# Patient Record
Sex: Male | Born: 1945
Health system: Southern US, Community
[De-identification: ages and names within clinical notes are randomized; demographics above are authoritative.]

## PROBLEM LIST (undated history)

## (undated) DIAGNOSIS — G4730 Sleep apnea, unspecified: Secondary | ICD-10-CM

## (undated) DIAGNOSIS — E079 Disorder of thyroid, unspecified: Secondary | ICD-10-CM

## (undated) DIAGNOSIS — I10 Essential (primary) hypertension: Secondary | ICD-10-CM

## (undated) DIAGNOSIS — H8101 Meniere's disease, right ear: Secondary | ICD-10-CM

## (undated) HISTORY — PX: CARDIAC CATHETERIZATION: SHX172

## (undated) HISTORY — PX: BLOOD PATCH: SHX1245

---

## 2008-11-03 ENCOUNTER — Encounter: Admission: RE | Admit: 2008-11-03 | Discharge: 2008-11-03 | Payer: Self-pay | Admitting: Cardiology

## 2008-11-07 ENCOUNTER — Ambulatory Visit (HOSPITAL_COMMUNITY): Admission: RE | Admit: 2008-11-07 | Discharge: 2008-11-07 | Payer: Self-pay | Admitting: Cardiology

## 2009-08-19 IMAGING — CR DG CHEST 2V
2 series · 2 of 2 positions shown · non-contrast
Comparison: None

CLINICAL DATA: Chest pain, cough, short of breath, pre cardiac
catheterization

CHEST - 2 VIEW

[view not recorded (1 of 2)]
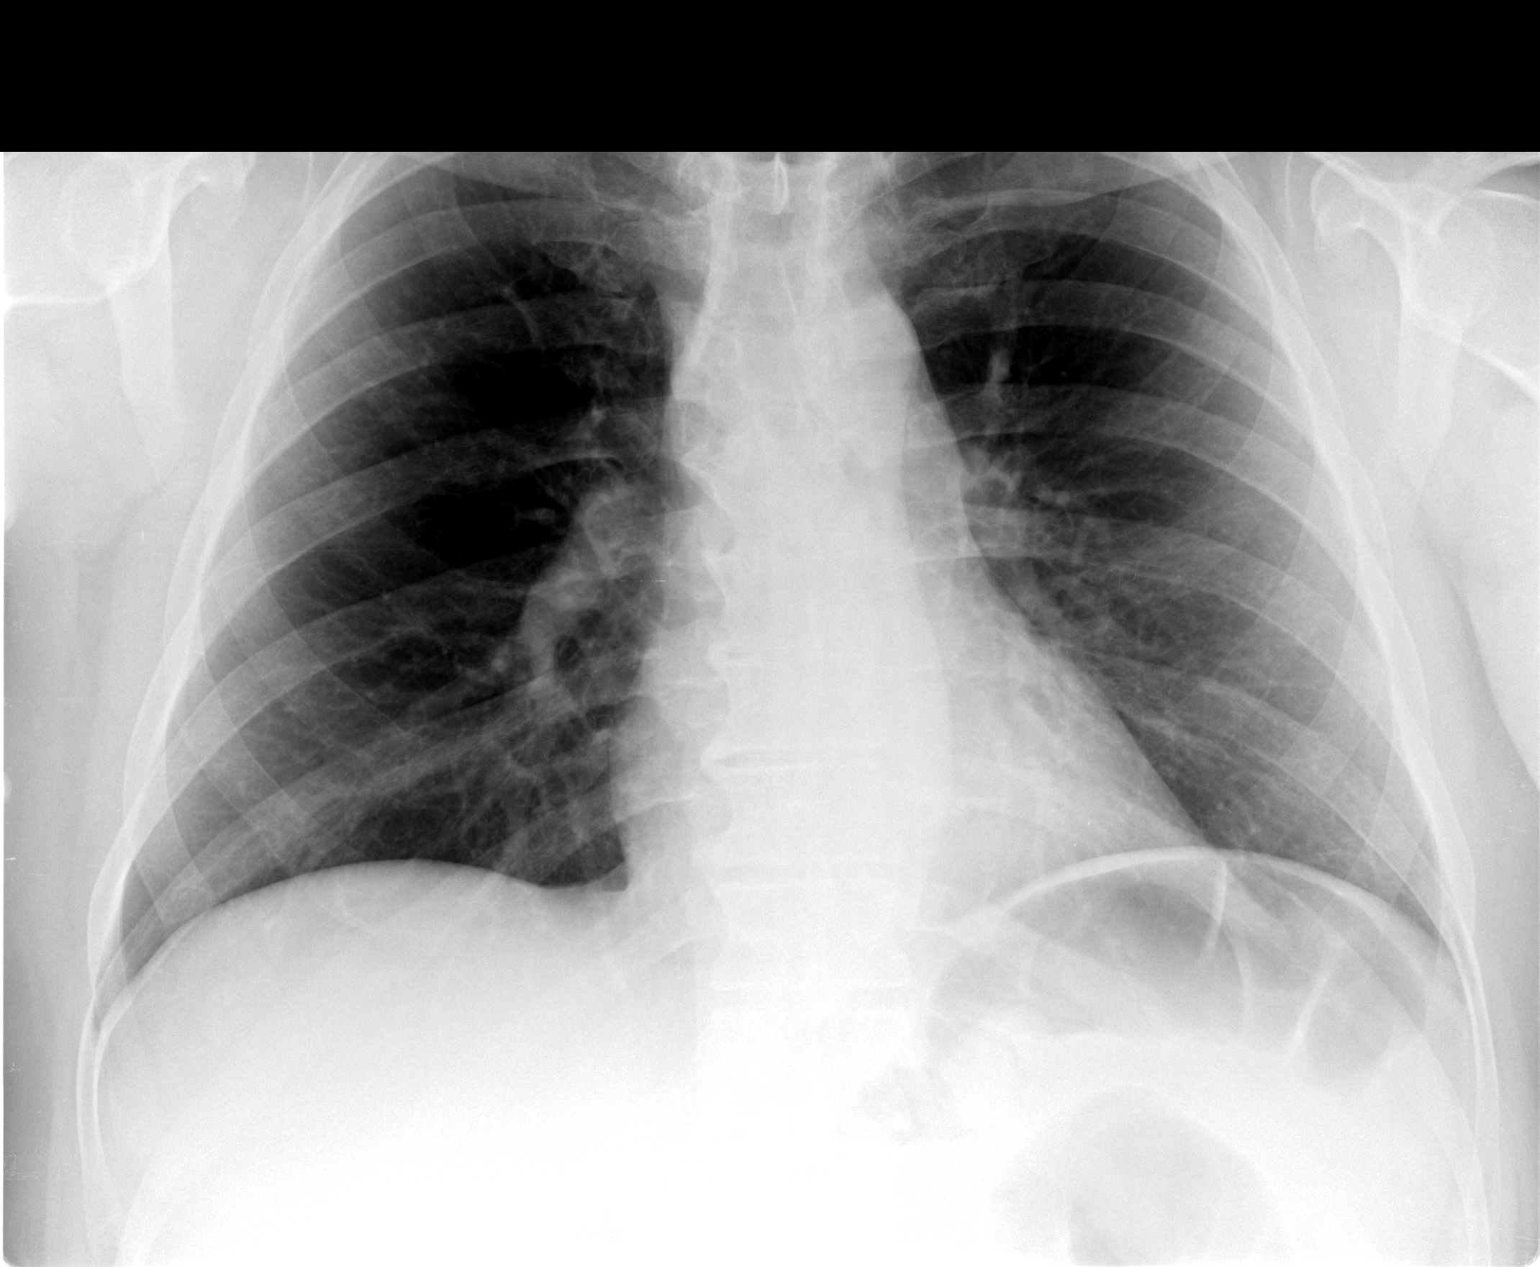

[view not recorded (2 of 2)]
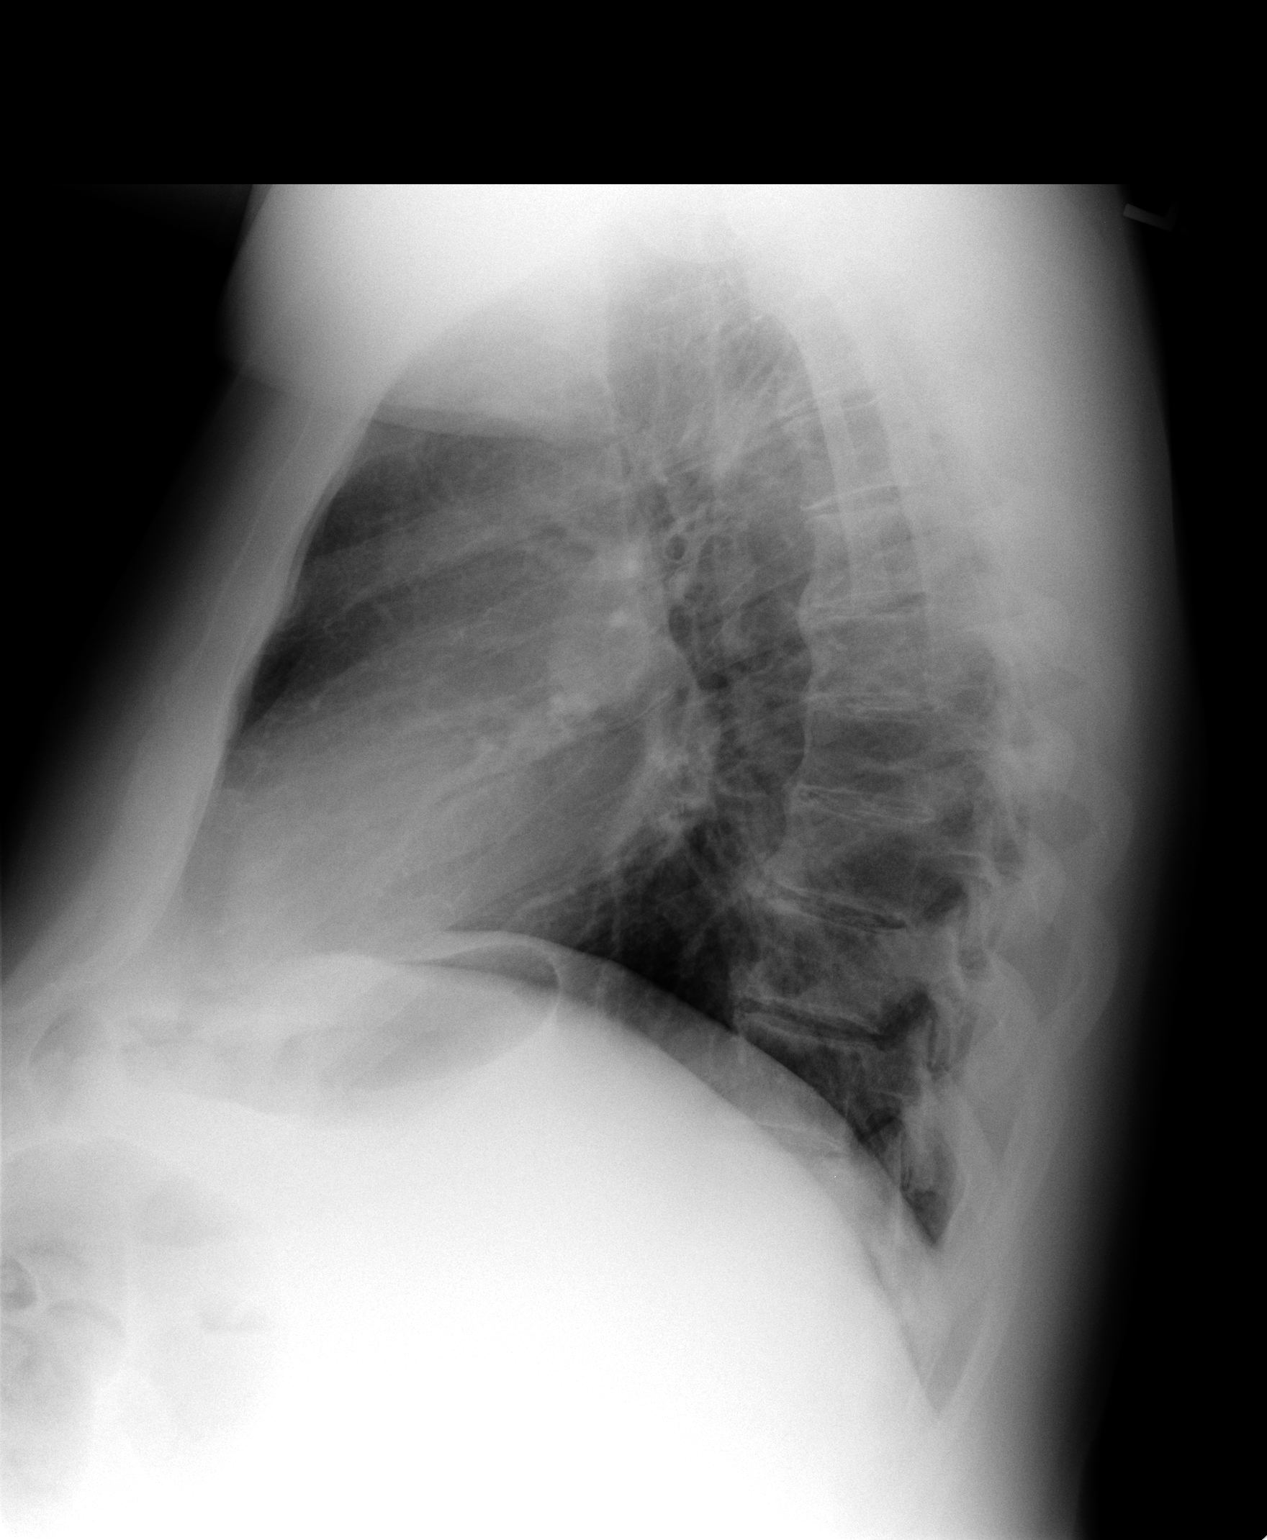

[2 of 2 positions shown; findings below may reference images not displayed]

FINDINGS: The lungs are clear.  The heart is within upper limits of
normal.  There are degenerative changes throughout the thoracic
spine.
IMPRESSION: No active lung disease.  Borderline cardiomegaly.

## 2011-03-01 NOTE — Cardiovascular Report (Signed)
NAMEKHADIM, Jeffery Ramsey             ACCOUNT NO.:  [id number]   MEDICAL RECORD NO.:  [id number]          PATIENT TYPE:  OIB   LOCATION:  2899                         FACILITY:  [facility]   PHYSICIAN:  Sheliah Mends, MD      DATE OF BIRTH:  04/16/1946   DATE OF PROCEDURE:  11/07/2008  DATE OF DISCHARGE:  11/07/2008                            CARDIAC CATHETERIZATION   PROCEDURES:  Retrograde aortography, selective coronary angiography, and  left ventriculogram.   CARDIOLOGIST:  1. Sheliah Mends, MD  2. Nicki Guadalajara, MD   INDICATIONS:  This is a 65 year old gentleman who was referred with  frequent episodes of chest tightness and chest pressure.  [name]  has multiple risk factors for coronary artery disease including age-  obesity, hypertension, dyslipidemia, and a strong family history.  He  had an extensive noninvasive workup that did not reveal any significant  abnormalities.  In context of his symptoms and risk factors, the  decision was made to proceed with coronary angiography.   PROCEDURE:  After informed consent was obtained, the patient was brought  to the second floor cardiac cath lab at [facility].  Access was  obtained via the right common femoral artery using the modified  Seldinger technique.  A 5-French arterial sheath was placed and  subsequently selective coronary angiography of the left and right  coronary artery system performed using a right and left 5-French Judkins  catheter.   FINDINGS:  Left coronary artery system.  The left main coronary artery  bifurcates into the LAD and left circumflex artery.  The left main  artery is short, medium caliber vessel.  The left main artery has no  angiographically significant disease.  The LAD is a large vessel that  wraps around the apex.  It gives off 2 diagonal branches.  The LAD is a  large size medium caliber vessel without angiographically significant  disease.  The diagonal branches are free of disease.   The left  circumflex artery is a medium-size vessel that gives off a large  branching obtuse marginal branch.  The left circumflex artery has no  angiographically significant disease.  The selective coronary  angiography of the right coronary artery shows a dominant right coronary  artery with PDA branch.  There is no angiographically significant  disease.   HEMODYNAMICS:  Left ventricular pressure 130/0 mmHg, aortic pressure  125/55 mmHg.   The procedure was completed without complication.   SUMMARY:  1. No angiographically significant coronary artery disease in the left      or right coronary artery system.  2. Normal left ventricular function.  3. No significant mitral or aortic regurgitation.   Dr. Nicki Guadalajara was present throughout the procedure and proctored the  case.      Sheliah Mends, MD  Electronically Signed     JE/MEDQ  D:  11/07/2008  T:  11/07/2008  Job:  [id number]

## 2011-06-16 ENCOUNTER — Encounter (HOSPITAL_COMMUNITY): Payer: Self-pay

## 2011-06-16 ENCOUNTER — Ambulatory Visit (HOSPITAL_COMMUNITY)
Admission: RE | Admit: 2011-06-16 | Discharge: 2011-06-16 | Disposition: A | Discharge status: Home / Self Care | Payer: PRIVATE HEALTH INSURANCE | Source: Ambulatory Visit | Attending: Internal Medicine | Admitting: Internal Medicine

## 2011-06-16 DIAGNOSIS — R609 Edema, unspecified: Secondary | ICD-10-CM | POA: Insufficient documentation

## 2011-06-16 DIAGNOSIS — R0602 Shortness of breath: Secondary | ICD-10-CM | POA: Insufficient documentation

## 2011-06-16 HISTORY — DX: Meniere's disease, right ear: H81.01

## 2011-06-16 HISTORY — DX: Sleep apnea, unspecified: G47.30

## 2011-06-16 HISTORY — DX: Essential (primary) hypertension: I10

## 2011-06-16 HISTORY — DX: Disorder of thyroid, unspecified: E07.9

## 2011-06-16 NOTE — Patient Instructions (Signed)
Your physician has recommended that you have a pulmonary function test. Pulmonary Function Tests are a group of tests that measure how well air moves in and out of your lungs.  Your physician has requested that you have an echocardiogram. Echocardiography is a painless test that uses sound waves to create images of your heart. It provides your doctor with information about the size and shape of your heart and how well your heart's chambers and valves are working. This procedure takes approximately one hour. There are no restrictions for this procedure.  Your physician recommends that you schedule a follow-up appointment in: 1 month

## 2011-06-16 NOTE — Assessment & Plan Note (Signed)
Based on the results of his cardiac cath and current symptoms, I suspect his dyspnea is not primarily cardiac in nature. Instead, I think it is likely due to untreated OSA, obesity and deconditioning. I have suggested he follow up with his sleep study as Dr. Marina Goodell has suggested. To complete the w/u will get a 2-D echo to evaluate diastolic dysfunction and pulmonary HTN. We will also check PFTs. Have advised him also on the need for weight loss and [facility]

## 2011-06-16 NOTE — Assessment & Plan Note (Signed)
I suspect this is due to venous insufficiency. As he has not improved with lasix will have him stop this and just use prn for severe edema. Can use compression stockings as needed. Check echo to evaluate for pulm HTN or diastolic dysfunction as contributors.

## 2011-06-21 ENCOUNTER — Ambulatory Visit (HOSPITAL_COMMUNITY)
Admission: RE | Admit: 2011-06-21 | Discharge: 2011-06-21 | Disposition: A | Discharge status: Home / Self Care | Payer: PRIVATE HEALTH INSURANCE | Source: Ambulatory Visit | Attending: Internal Medicine | Admitting: Internal Medicine

## 2011-06-21 DIAGNOSIS — I509 Heart failure, unspecified: Secondary | ICD-10-CM | POA: Insufficient documentation

## 2011-06-21 LAB — PULMONARY FUNCTION TEST

## 2011-07-15 ENCOUNTER — Encounter: Payer: Self-pay | Admitting: Internal Medicine

## 2011-07-20 ENCOUNTER — Other Ambulatory Visit (HOSPITAL_COMMUNITY): Payer: PRIVATE HEALTH INSURANCE

## 2011-07-20 ENCOUNTER — Ambulatory Visit (HOSPITAL_COMMUNITY): Payer: PRIVATE HEALTH INSURANCE

## 2011-07-28 ENCOUNTER — Ambulatory Visit (HOSPITAL_COMMUNITY)
Admission: RE | Admit: 2011-07-28 | Discharge: 2011-07-28 | Disposition: A | Discharge status: Home / Self Care | Payer: PRIVATE HEALTH INSURANCE | Source: Ambulatory Visit | Attending: Internal Medicine | Admitting: Internal Medicine

## 2011-07-28 DIAGNOSIS — R609 Edema, unspecified: Secondary | ICD-10-CM

## 2011-07-28 DIAGNOSIS — I509 Heart failure, unspecified: Secondary | ICD-10-CM | POA: Insufficient documentation

## 2011-07-28 DIAGNOSIS — I10 Essential (primary) hypertension: Secondary | ICD-10-CM | POA: Insufficient documentation

## 2011-07-28 DIAGNOSIS — R0602 Shortness of breath: Secondary | ICD-10-CM

## 2011-07-28 DIAGNOSIS — I059 Rheumatic mitral valve disease, unspecified: Secondary | ICD-10-CM

## 2011-07-28 MED ORDER — SPIRONOLACTONE 25 MG PO TABS: ORAL_TABLET | ORAL | Status: AC

## 2011-07-28 NOTE — Assessment & Plan Note (Addendum)
ECHO reviewed. LVEF normal with mild diastolic dysfunction. RV mildly dilated with normal function. No evidence of PAH. PFTs normal. I suspect majority of his symptoms are due to his obesity and deconditioning. Unfortunately continues to gain weight. Stress need for weight loss. Will start spironolactone 12.5 daily to help with HTN and fluid status. He will be return to his primary care physician.

## 2011-07-28 NOTE — Progress Notes (Signed)
  HPI:  Mr. Seymore is a  65 y/o male with h/o HTN, HL, obesity and OSA who returns for f/u in the HF clinic.  In 10/2008 underwent cath by [facility] for CP and dyspnea. Coronaries were normal. LVEDP was low. We saw him in 8/12 in the HF clinic for further evaluation of dyspnea and edema which was not improving with lasix. We suspected that his dyspnea was likely due to untreated OSA and he had some venous insufficiency. Lasix stopped. We ordered echo for further evaluation of diastolic filling parameters and PFTs.  PFTs showed normal spirometry (06/21/2011). DLCO not checked.   He had echo this am which we reviewed. EF 60-65% Mild diastolic dysfunction. RV mildly dilated with normal function. No evidence of PAH on echo  Continues to have dyspnea on exertion. Dizziness improved with Meclizine. Sleeps in recliner.  He is not exercising. Weight is up 13 pounds.  He has support stockings but he can not put them on. He had a sleep study 07/25/2011. He is unemployed. He is able pay for his medications now however he expects to lose his insurance by the end of the year.   Walked in clinic sats 100% at rest. 94% with exertion.    ROS: All systems negative except as listed in HPI, PMH and Problem List.  Past Medical History  Diagnosis Date  . Thyroid disease   . Hypertension   . Meniere's disease of right ear   . Sleep apnea     Current Outpatient Prescriptions  Medication Sig Dispense Refill  . aspirin 81 MG tablet Take 81 mg by mouth daily.        Marland Kitchen levothyroxine (SYNTHROID, LEVOTHROID) 125 MCG tablet Take 125 mcg by mouth daily.        . meclizine (ANTIVERT) 25 MG tablet Take 25 mg by mouth 3 (three) times daily.        Marland Kitchen olmesartan-hydrochlorothiazide (BENICAR HCT) 20-12.5 MG per tablet Take 1 tablet by mouth daily.           PHYSICAL EXAM: Filed Vitals:   07/28/11 1119  BP: 140/72  Pulse: 60   General:  Well appearing. No resp difficulty. O2 sats. 94% HEENT: normal Neck: supple.  Thick. JVP hard to assess - does not appear markedly elevated. Carotids 2+ bilaterally; no bruits. No lymphadenopathy or thryomegaly appreciated. Cor: PMI normal. Regular rate & rhythm. No rubs, gallops or murmurs. Lungs: clear Abdomen: markedly obese. soft, nontender, nondistended. No hepatosplenomegaly. No bruits or masses. Good bowel sounds. Extremities: no cyanosis, clubbing, rash. Tr-1+ edema Neuro: alert & orientedx3, cranial nerves grossly intact. Moves all 4 extremities w/o difficulty. Affect pleasant   ASSESSMENT & PLAN:

## 2011-07-28 NOTE — Patient Instructions (Signed)
Please take Spironolactone 12.5 mg daily   Please start exercising  Please follow up with your primary care physician next week for lab work.

## 2011-07-28 NOTE — Assessment & Plan Note (Addendum)
Instructed to wear compression stockings while out of bed. Start Spironolactone 12.5 mg daily. He will need BMET next week.   Patient seen and examined with Tonye Becket, NP. We discussed all aspects of the encounter. I agree with the assessment and plan as stated above.

## 2011-08-22 ENCOUNTER — Encounter: Payer: Self-pay | Admitting: Internal Medicine

## 2013-03-26 ENCOUNTER — Encounter: Payer: Self-pay | Admitting: Vascular Surgery

## 2013-03-26 ENCOUNTER — Other Ambulatory Visit: Payer: Self-pay | Admitting: *Deleted

## 2013-03-26 DIAGNOSIS — I872 Venous insufficiency (chronic) (peripheral): Secondary | ICD-10-CM

## 2013-04-18 ENCOUNTER — Encounter: Payer: Self-pay | Admitting: Surgery

## 2013-04-22 ENCOUNTER — Encounter: Payer: PRIVATE HEALTH INSURANCE | Admitting: Vascular Surgery

## 2014-07-08 ENCOUNTER — Other Ambulatory Visit: Payer: Self-pay

## 2014-07-08 DIAGNOSIS — L97909 Non-pressure chronic ulcer of unspecified part of unspecified lower leg with unspecified severity: Secondary | ICD-10-CM

## 2014-07-08 DIAGNOSIS — R609 Edema, unspecified: Secondary | ICD-10-CM

## 2014-07-31 ENCOUNTER — Encounter (HOSPITAL_COMMUNITY): Payer: Medicare PPO

## 2014-07-31 ENCOUNTER — Encounter: Payer: PRIVATE HEALTH INSURANCE | Admitting: Vascular Surgery

## 2014-08-27 ENCOUNTER — Encounter: Payer: Self-pay | Admitting: Vascular Surgery

## 2014-08-28 ENCOUNTER — Encounter (INDEPENDENT_AMBULATORY_CARE_PROVIDER_SITE_OTHER): Payer: Self-pay

## 2014-08-28 ENCOUNTER — Ambulatory Visit (HOSPITAL_COMMUNITY)
Admission: RE | Admit: 2014-08-28 | Discharge: 2014-08-28 | Disposition: A | Discharge status: Home / Self Care | Payer: Medicare PPO | Source: Ambulatory Visit | Attending: Vascular Surgery | Admitting: Vascular Surgery

## 2014-08-28 ENCOUNTER — Ambulatory Visit (INDEPENDENT_AMBULATORY_CARE_PROVIDER_SITE_OTHER): Payer: Medicare PPO | Admitting: Vascular Surgery

## 2014-08-28 ENCOUNTER — Encounter: Payer: Self-pay | Admitting: Vascular Surgery

## 2014-08-28 VITALS — BP 124/69 | HR 57 | Ht 72.0 in | Wt 305.0 lb

## 2014-08-28 DIAGNOSIS — R609 Edema, unspecified: Secondary | ICD-10-CM | POA: Insufficient documentation

## 2014-08-28 DIAGNOSIS — I83025 Varicose veins of left lower extremity with ulcer other part of foot: Secondary | ICD-10-CM

## 2014-08-28 DIAGNOSIS — L97909 Non-pressure chronic ulcer of unspecified part of unspecified lower leg with unspecified severity: Secondary | ICD-10-CM | POA: Diagnosis not present

## 2014-08-28 DIAGNOSIS — I83021 Varicose veins of left lower extremity with ulcer of thigh: Secondary | ICD-10-CM

## 2014-08-28 DIAGNOSIS — I83024 Varicose veins of left lower extremity with ulcer of heel and midfoot: Secondary | ICD-10-CM

## 2014-08-28 DIAGNOSIS — I83023 Varicose veins of left lower extremity with ulcer of ankle: Secondary | ICD-10-CM

## 2014-08-28 DIAGNOSIS — L97929 Non-pressure chronic ulcer of unspecified part of left lower leg with unspecified severity: Secondary | ICD-10-CM

## 2014-08-28 DIAGNOSIS — I83022 Varicose veins of left lower extremity with ulcer of calf: Secondary | ICD-10-CM

## 2014-08-28 DIAGNOSIS — I83028 Varicose veins of left lower extremity with ulcer other part of lower leg: Secondary | ICD-10-CM

## 2014-08-28 DIAGNOSIS — I83029 Varicose veins of left lower extremity with ulcer of unspecified site: Secondary | ICD-10-CM

## 2014-08-28 NOTE — Progress Notes (Signed)
Referred by:  Jeffery MiyamotoLawrence Edward Perry, MD [address] [place], [place] [id number]  Reason for referral: Bilateral leg swelling  History of Present Illness  Jeffery Ramsey is a 68 y.o. (03-Feb-1946) male who presents with chief complaint: bilateral leg swelling. He has a history of morbid obesity, hypertension and obstructive sleep apnea.  Patient notes, onset of swelling years ago, with progressive worsening in the past year. The patient has had no history of DVT. He has had a history of venous stasis ulcers with history of skin infections. He has a positive history of skin changes in lower legs. The patient is unsure of the reason for his swelling. He has tried unna boots in the past. He also also worn compression stockings but stopped due to irritation of his skin wounds. The patient understands that he needs to lose weight but reports being unable to exercise due to "low stamina" and "being worn out" with minor activity. He saw Dr. Gala Ramsey in 2012 for dyspnea on exertion. His ECHO revealed an EF of 60-65%. At that time, he also had some lower extremity venous insufficiency.   He has hypothyroidism. He has hypertension managed on multiple medication.  Past Medical History  Diagnosis Date  . Thyroid disease   . Hypertension   . Meniere's disease of right ear   . Sleep apnea     Past Surgical History  Procedure Laterality Date  . Cardiac catheterization    . Blood patch      March 202    History   Social History  . Marital Status: Married    Spouse Name: N/A    Number of Children: N/A  . Years of Education: N/A   Occupational History  . Not on file.   Social History Main Topics  . Smoking status: Former Smoker -- 0.25 packs/day    Quit date: 06/15/1969  . Smokeless tobacco: Never Used  . Alcohol Use: No  . Drug Use: No  . Sexual Activity: No   Other Topics Concern  . Not on file   Social History Narrative    Family History  Problem Relation Age of Onset  .  Heart failure Father   . Heart disease Father   . Anemia Brother   . Heart attack Brother   . Cancer Brother   . Hypertension Brother   . Heart disease Brother   . Heart disease Paternal Aunt   . Heart disease Maternal Grandmother      Current Outpatient Prescriptions on File Prior to Visit  Medication Sig Dispense Refill  . aspirin 81 MG tablet Take 81 mg by mouth daily.      . furosemide (LASIX) 40 MG tablet Take 40 mg by mouth daily.    Marland Kitchen. levothyroxine (SYNTHROID, LEVOTHROID) 125 MCG tablet Take 125 mcg by mouth daily. Per notes patient is taking Synthroid 175 mcg once daily    . losartan-hydrochlorothiazide (HYZAAR) 100-12.5 MG per tablet Take 1 tablet by mouth daily.    . sertraline (ZOLOFT) 25 MG tablet Take 25 mg by mouth daily.    . tamsulosin (FLOMAX) 0.4 MG CAPS Take 0.4 mg by mouth daily.    . meclizine (ANTIVERT) 25 MG tablet Take 25 mg by mouth 3 (three) times daily.      Marland Kitchen. olmesartan-hydrochlorothiazide (BENICAR HCT) 20-12.5 MG per tablet Take 1 tablet by mouth daily.      Marland Kitchen. spironolactone (ALDACTONE) 25 MG tablet Take 12.5 mg po daily 30 tablet 6  No current facility-administered medications on file prior to visit.    Allergies  Allergen Reactions  . Codeine   . Morphine And Related     REVIEW OF SYSTEMS:  (Positives checked otherwise negative)  CARDIOVASCULAR:  []  chest pain, []  chest pressure, []  palpitations, []  shortness of breath when laying flat, []  shortness of breath with exertion,  []  pain in feet when walking, []  pain in feet when laying flat, []  history of blood clot in veins (DVT), []  history of phlebitis, [x]  swelling in legs, []  varicose veins  PULMONARY:  []  productive cough, []  asthma, []  wheezing  NEUROLOGIC:  []  weakness in arms or legs, [x]  numbness in arms or legs, []  difficulty speaking or slurred speech, []  temporary loss of vision in one eye, []  dizziness  HEMATOLOGIC:  []  bleeding problems, []  problems with blood clotting too  easily  MUSCULOSKEL:  []  joint pain, []  joint swelling  GASTROINTEST:  []  vomiting blood, []  blood in stool     GENITOURINARY:  []  burning with urination, []  blood in urine  PSYCHIATRIC:  []  history of major depression  INTEGUMENTARY:  []  rashes, [x]  ulcers  CONSTITUTIONAL:  []  fever, []  chills   Physical Examination Filed Vitals:   08/28/14 1327  BP: 124/69  Pulse: 57  Height: 6' (1.829 m)  Weight: 305 lb (138.347 kg)  SpO2: 100%   Body mass index is 41.36 kg/(m^2).  General: A&O x 3 morbidly obese male in NAD  Head: [place]/AT  Neck: Supple, no nuchal rigidity  Pulmonary: Sym exp, good air movt, CTAB, no rales, rhonchi, & wheezing  Cardiac: RRR, Nl S1, S2, no Murmurs, rubs or gallops, no carotid bruits.   Vascular: biphasic PT and DP doppler signals bilaterally  Musculoskeletal: Extremities without ischemic changes.   Neurologic:  Pain and light touch intact in extremities   Psychiatric: Judgment intact, Mood & affect appropriate for pt's clinical situation  Dermatologic: Severe edema of bilateral lower extremities with chronic venous insufficiency changes. Bilateral woody appearance with hemosiderin staining. Venous stasis ulcer of left anterior shin.    Non-Invasive Vascular Imaging  BLE Venous Insufficiency Duplex (Date: 08/28/2014):   RLE: negative DVT and SVT, some GSV reflux, positive deep venous reflux  LLE: negative DVT and SVT, positive GSV and LSV reflux, postive deep venous reflux  Medical Decision Making  Jeffery Ramsey is a 68 y.o. male who presents with: severe BLE chronic venous insufficiency with ulceration.    Based on the patient's history and examination, the patient will require weekly unna boots.   Strongly emphasized the need for compression therapy and weight loss.   He does have some great saphenous reflux on the left but given the severity of his symptoms, laser ablation is unlikely to be beneficial.   He will follow up for unna  boot changes weekly x 3 and see Dr. Darrick Ramsey in 4 weeks.   The patient and his wife are both on social security are concerned about being able to afford co-pays.   Maris BergerKimberly Chanel Mcadams, PA-C [facility] [place] Office: [phone] Pager: [phone]  08/28/2014, 2:09 PM  This patient was seen and examined in conjunction with Dr. Darrick Ramsey   History and exam details as above.  Chronic venous issues primarily deep vein reflux but may benefit from laser ablation on left.  We will see how he does with compliance before considering this.  Fabienne Brunsharles Fields, MD [facility] [place] Office: [phone] Pager: [phone]

## 2014-09-04 ENCOUNTER — Ambulatory Visit (INDEPENDENT_AMBULATORY_CARE_PROVIDER_SITE_OTHER): Payer: Medicare PPO | Admitting: *Deleted

## 2014-09-04 VITALS — Ht 72.0 in | Wt 310.1 lb

## 2014-09-04 DIAGNOSIS — L97909 Non-pressure chronic ulcer of unspecified part of unspecified lower leg with unspecified severity: Secondary | ICD-10-CM

## 2014-09-04 DIAGNOSIS — I83812 Varicose veins of left lower extremities with pain: Secondary | ICD-10-CM

## 2014-09-04 DIAGNOSIS — I83009 Varicose veins of unspecified lower extremity with ulcer of unspecified site: Secondary | ICD-10-CM | POA: Insufficient documentation

## 2014-09-04 DIAGNOSIS — L97929 Non-pressure chronic ulcer of unspecified part of left lower leg with unspecified severity: Principal | ICD-10-CM

## 2014-09-04 DIAGNOSIS — I83029 Varicose veins of left lower extremity with ulcer of unspecified site: Secondary | ICD-10-CM

## 2014-09-04 NOTE — Progress Notes (Signed)
Weekly Unna Ramsey change. Mr. Jeffery Ramsey removed Jeffery Ramsey last night and showered.  Today, left  calf ulcer appears superificial.  No drainage or odor detected.  Pt. Reports no pain in or around ulcer.  Unna Ramsey reapplied to leg leg.  No ulcerations on right leg. Verified with Dr. Darrick PennaFields that no Jeffery Ramsey was needed on right leg as no ulcerations are present.  Dr. Darrick PennaFields states for Mr. Jeffery Ramsey to wear compression hose on right leg.  Pt. Aware and states he will wear compression hose on right leg daily.  Mr. Jeffery Ramsey to return on 09-10-2014 for weekly Unna Ramsey visit.

## 2014-09-10 ENCOUNTER — Ambulatory Visit (INDEPENDENT_AMBULATORY_CARE_PROVIDER_SITE_OTHER): Payer: Medicare PPO

## 2014-09-10 DIAGNOSIS — L97929 Non-pressure chronic ulcer of unspecified part of left lower leg with unspecified severity: Principal | ICD-10-CM

## 2014-09-10 DIAGNOSIS — I83021 Varicose veins of left lower extremity with ulcer of thigh: Secondary | ICD-10-CM

## 2014-09-10 DIAGNOSIS — I83029 Varicose veins of left lower extremity with ulcer of unspecified site: Secondary | ICD-10-CM

## 2014-09-10 NOTE — Progress Notes (Signed)
Mr. Jeffery Ramsey was seen today for reapplication of unna boot to left lower extremity per Dr. Darrick PennaFields . The previous unna boot had been removed so that he could shower. A new unna boot was reapplied without difficulty. He will return next week for reapplication.

## 2014-09-18 ENCOUNTER — Encounter: Payer: Self-pay | Admitting: Vascular Surgery

## 2014-09-18 ENCOUNTER — Ambulatory Visit (INDEPENDENT_AMBULATORY_CARE_PROVIDER_SITE_OTHER): Payer: Medicare PPO | Admitting: Vascular Surgery

## 2014-09-18 VITALS — BP 136/62 | HR 64 | Temp 98.1°F | Resp 16 | Ht 72.0 in | Wt 304.0 lb

## 2014-09-18 DIAGNOSIS — L97929 Non-pressure chronic ulcer of unspecified part of left lower leg with unspecified severity: Secondary | ICD-10-CM

## 2014-09-18 DIAGNOSIS — I83029 Varicose veins of left lower extremity with ulcer of unspecified site: Secondary | ICD-10-CM

## 2014-09-18 DIAGNOSIS — I87312 Chronic venous hypertension (idiopathic) with ulcer of left lower extremity: Secondary | ICD-10-CM

## 2014-09-18 NOTE — Progress Notes (Signed)
Patient is a 68 year old male who returns for follow-up today. He has been receiving Unna boot therapy for a stasis ulcer on the left leg pretibial area for the last few weeks. He returns today. He states the ulcer has healed.  Physical exam:  Filed Vitals:   09/18/14 1423  BP: 136/62  Pulse: 64  Temp: 98.1 F (36.7 C)  TempSrc: Oral  Resp: 16  Height: 6' (1.829 m)  Weight: 304 lb (137.893 kg)  SpO2: 98%    Lower extremities: Bilateral circumferential venous staining no open ulcerations bilaterally  Assessment: Healed venous stasis ulcer left leg Plan: Continue bilateral compression stockings. Patient will follow-up on as-needed basis. If he continues to develop ulcerations we would consider whether or not laser ablation might be of value to him.  Fabienne Brunsharles Fields, MD [facility] [place] Office: [phone] Pager: [phone]

## 2014-09-25 ENCOUNTER — Ambulatory Visit: Payer: Medicare PPO | Admitting: Vascular Surgery

## 2015-04-01 DIAGNOSIS — I83019 Varicose veins of right lower extremity with ulcer of unspecified site: Secondary | ICD-10-CM | POA: Diagnosis not present

## 2015-04-01 DIAGNOSIS — L97829 Non-pressure chronic ulcer of other part of left lower leg with unspecified severity: Secondary | ICD-10-CM | POA: Diagnosis not present

## 2015-04-01 DIAGNOSIS — Z6841 Body Mass Index (BMI) 40.0 and over, adult: Secondary | ICD-10-CM | POA: Diagnosis not present

## 2015-04-03 DIAGNOSIS — I83019 Varicose veins of right lower extremity with ulcer of unspecified site: Secondary | ICD-10-CM | POA: Diagnosis not present

## 2015-04-03 DIAGNOSIS — L97829 Non-pressure chronic ulcer of other part of left lower leg with unspecified severity: Secondary | ICD-10-CM | POA: Diagnosis not present

## 2015-04-07 DIAGNOSIS — I83019 Varicose veins of right lower extremity with ulcer of unspecified site: Secondary | ICD-10-CM | POA: Diagnosis not present

## 2015-04-07 DIAGNOSIS — L97829 Non-pressure chronic ulcer of other part of left lower leg with unspecified severity: Secondary | ICD-10-CM | POA: Diagnosis not present

## 2015-04-07 DIAGNOSIS — Z6841 Body Mass Index (BMI) 40.0 and over, adult: Secondary | ICD-10-CM | POA: Diagnosis not present

## 2015-04-13 DIAGNOSIS — Z6841 Body Mass Index (BMI) 40.0 and over, adult: Secondary | ICD-10-CM | POA: Diagnosis not present

## 2015-04-13 DIAGNOSIS — L97829 Non-pressure chronic ulcer of other part of left lower leg with unspecified severity: Secondary | ICD-10-CM | POA: Diagnosis not present

## 2015-04-13 DIAGNOSIS — I83019 Varicose veins of right lower extremity with ulcer of unspecified site: Secondary | ICD-10-CM | POA: Diagnosis not present

## 2015-04-17 DIAGNOSIS — I872 Venous insufficiency (chronic) (peripheral): Secondary | ICD-10-CM | POA: Diagnosis not present

## 2015-04-17 DIAGNOSIS — L97811 Non-pressure chronic ulcer of other part of right lower leg limited to breakdown of skin: Secondary | ICD-10-CM | POA: Diagnosis not present

## 2015-04-17 DIAGNOSIS — M1990 Unspecified osteoarthritis, unspecified site: Secondary | ICD-10-CM | POA: Diagnosis not present

## 2015-04-17 DIAGNOSIS — I10 Essential (primary) hypertension: Secondary | ICD-10-CM | POA: Diagnosis not present

## 2015-04-17 DIAGNOSIS — I890 Lymphedema, not elsewhere classified: Secondary | ICD-10-CM | POA: Diagnosis not present

## 2015-04-17 DIAGNOSIS — L97821 Non-pressure chronic ulcer of other part of left lower leg limited to breakdown of skin: Secondary | ICD-10-CM | POA: Diagnosis not present

## 2015-04-17 DIAGNOSIS — Z87891 Personal history of nicotine dependence: Secondary | ICD-10-CM | POA: Diagnosis not present

## 2015-04-17 DIAGNOSIS — I739 Peripheral vascular disease, unspecified: Secondary | ICD-10-CM | POA: Diagnosis not present

## 2015-04-17 DIAGNOSIS — G4730 Sleep apnea, unspecified: Secondary | ICD-10-CM | POA: Diagnosis not present

## 2015-04-24 DIAGNOSIS — I872 Venous insufficiency (chronic) (peripheral): Secondary | ICD-10-CM | POA: Diagnosis not present

## 2015-04-24 DIAGNOSIS — Z09 Encounter for follow-up examination after completed treatment for conditions other than malignant neoplasm: Secondary | ICD-10-CM | POA: Diagnosis not present

## 2015-04-24 DIAGNOSIS — Z872 Personal history of diseases of the skin and subcutaneous tissue: Secondary | ICD-10-CM | POA: Diagnosis not present

## 2015-04-24 DIAGNOSIS — I890 Lymphedema, not elsewhere classified: Secondary | ICD-10-CM | POA: Diagnosis not present

## 2015-04-28 DIAGNOSIS — Z872 Personal history of diseases of the skin and subcutaneous tissue: Secondary | ICD-10-CM | POA: Diagnosis not present

## 2015-04-28 DIAGNOSIS — I890 Lymphedema, not elsewhere classified: Secondary | ICD-10-CM | POA: Diagnosis not present

## 2015-05-01 DIAGNOSIS — I890 Lymphedema, not elsewhere classified: Secondary | ICD-10-CM | POA: Diagnosis not present

## 2015-05-05 DIAGNOSIS — S81801A Unspecified open wound, right lower leg, initial encounter: Secondary | ICD-10-CM | POA: Diagnosis not present

## 2015-05-05 DIAGNOSIS — I872 Venous insufficiency (chronic) (peripheral): Secondary | ICD-10-CM | POA: Diagnosis not present

## 2015-05-05 DIAGNOSIS — I890 Lymphedema, not elsewhere classified: Secondary | ICD-10-CM | POA: Diagnosis not present

## 2015-05-05 DIAGNOSIS — S81802A Unspecified open wound, left lower leg, initial encounter: Secondary | ICD-10-CM | POA: Diagnosis not present

## 2015-05-05 DIAGNOSIS — L97821 Non-pressure chronic ulcer of other part of left lower leg limited to breakdown of skin: Secondary | ICD-10-CM | POA: Diagnosis not present

## 2015-05-08 DIAGNOSIS — L97821 Non-pressure chronic ulcer of other part of left lower leg limited to breakdown of skin: Secondary | ICD-10-CM | POA: Diagnosis not present

## 2015-05-08 DIAGNOSIS — I890 Lymphedema, not elsewhere classified: Secondary | ICD-10-CM | POA: Diagnosis not present

## 2015-05-08 DIAGNOSIS — I872 Venous insufficiency (chronic) (peripheral): Secondary | ICD-10-CM | POA: Diagnosis not present

## 2015-05-12 DIAGNOSIS — I872 Venous insufficiency (chronic) (peripheral): Secondary | ICD-10-CM | POA: Diagnosis not present

## 2015-05-12 DIAGNOSIS — I890 Lymphedema, not elsewhere classified: Secondary | ICD-10-CM | POA: Diagnosis not present

## 2015-05-12 DIAGNOSIS — L97821 Non-pressure chronic ulcer of other part of left lower leg limited to breakdown of skin: Secondary | ICD-10-CM | POA: Diagnosis not present

## 2015-05-12 DIAGNOSIS — L97829 Non-pressure chronic ulcer of other part of left lower leg with unspecified severity: Secondary | ICD-10-CM | POA: Diagnosis not present

## 2015-05-15 DIAGNOSIS — L97829 Non-pressure chronic ulcer of other part of left lower leg with unspecified severity: Secondary | ICD-10-CM | POA: Diagnosis not present

## 2015-05-15 DIAGNOSIS — I890 Lymphedema, not elsewhere classified: Secondary | ICD-10-CM | POA: Diagnosis not present

## 2015-05-15 DIAGNOSIS — I872 Venous insufficiency (chronic) (peripheral): Secondary | ICD-10-CM | POA: Diagnosis not present

## 2016-04-21 DIAGNOSIS — N182 Chronic kidney disease, stage 2 (mild): Secondary | ICD-10-CM | POA: Diagnosis not present

## 2016-04-21 DIAGNOSIS — N400 Enlarged prostate without lower urinary tract symptoms: Secondary | ICD-10-CM | POA: Diagnosis not present

## 2016-04-21 DIAGNOSIS — I129 Hypertensive chronic kidney disease with stage 1 through stage 4 chronic kidney disease, or unspecified chronic kidney disease: Secondary | ICD-10-CM | POA: Diagnosis not present

## 2016-04-21 DIAGNOSIS — H9190 Unspecified hearing loss, unspecified ear: Secondary | ICD-10-CM | POA: Diagnosis not present

## 2016-04-21 DIAGNOSIS — E039 Hypothyroidism, unspecified: Secondary | ICD-10-CM | POA: Diagnosis not present

## 2016-04-21 DIAGNOSIS — I890 Lymphedema, not elsewhere classified: Secondary | ICD-10-CM | POA: Diagnosis not present

## 2016-04-21 DIAGNOSIS — F418 Other specified anxiety disorders: Secondary | ICD-10-CM | POA: Diagnosis not present

## 2016-04-21 DIAGNOSIS — F330 Major depressive disorder, recurrent, mild: Secondary | ICD-10-CM | POA: Diagnosis not present

## 2016-08-01 DIAGNOSIS — F341 Dysthymic disorder: Secondary | ICD-10-CM | POA: Diagnosis not present

## 2016-08-01 DIAGNOSIS — E039 Hypothyroidism, unspecified: Secondary | ICD-10-CM | POA: Diagnosis not present

## 2016-08-01 DIAGNOSIS — E785 Hyperlipidemia, unspecified: Secondary | ICD-10-CM | POA: Diagnosis not present

## 2016-08-01 DIAGNOSIS — G4733 Obstructive sleep apnea (adult) (pediatric): Secondary | ICD-10-CM | POA: Diagnosis not present

## 2016-08-01 DIAGNOSIS — E559 Vitamin D deficiency, unspecified: Secondary | ICD-10-CM | POA: Diagnosis not present

## 2016-08-01 DIAGNOSIS — H8109 Meniere's disease, unspecified ear: Secondary | ICD-10-CM | POA: Diagnosis not present

## 2016-08-01 DIAGNOSIS — R7303 Prediabetes: Secondary | ICD-10-CM | POA: Diagnosis not present

## 2016-08-01 DIAGNOSIS — Z23 Encounter for immunization: Secondary | ICD-10-CM | POA: Diagnosis not present

## 2016-08-01 DIAGNOSIS — I10 Essential (primary) hypertension: Secondary | ICD-10-CM | POA: Diagnosis not present

## 2017-01-30 DIAGNOSIS — R7303 Prediabetes: Secondary | ICD-10-CM | POA: Diagnosis not present

## 2017-01-30 DIAGNOSIS — G031 Chronic meningitis: Secondary | ICD-10-CM | POA: Diagnosis not present

## 2017-01-30 DIAGNOSIS — E038 Other specified hypothyroidism: Secondary | ICD-10-CM | POA: Diagnosis not present

## 2017-01-30 DIAGNOSIS — I87303 Chronic venous hypertension (idiopathic) without complications of bilateral lower extremity: Secondary | ICD-10-CM | POA: Diagnosis not present

## 2017-01-30 DIAGNOSIS — H906 Mixed conductive and sensorineural hearing loss, bilateral: Secondary | ICD-10-CM | POA: Diagnosis not present

## 2017-01-30 DIAGNOSIS — I10 Essential (primary) hypertension: Secondary | ICD-10-CM | POA: Diagnosis not present

## 2017-01-30 DIAGNOSIS — H8109 Meniere's disease, unspecified ear: Secondary | ICD-10-CM | POA: Diagnosis not present

## 2017-01-30 DIAGNOSIS — E782 Mixed hyperlipidemia: Secondary | ICD-10-CM | POA: Diagnosis not present

## 2017-08-07 DIAGNOSIS — I10 Essential (primary) hypertension: Secondary | ICD-10-CM | POA: Diagnosis not present

## 2017-08-07 DIAGNOSIS — G609 Hereditary and idiopathic neuropathy, unspecified: Secondary | ICD-10-CM | POA: Diagnosis not present

## 2017-08-07 DIAGNOSIS — G031 Chronic meningitis: Secondary | ICD-10-CM | POA: Diagnosis not present

## 2017-08-07 DIAGNOSIS — R7303 Prediabetes: Secondary | ICD-10-CM | POA: Diagnosis not present

## 2017-08-07 DIAGNOSIS — H8109 Meniere's disease, unspecified ear: Secondary | ICD-10-CM | POA: Diagnosis not present

## 2017-08-07 DIAGNOSIS — E782 Mixed hyperlipidemia: Secondary | ICD-10-CM | POA: Diagnosis not present

## 2017-08-07 DIAGNOSIS — H906 Mixed conductive and sensorineural hearing loss, bilateral: Secondary | ICD-10-CM | POA: Diagnosis not present

## 2017-08-07 DIAGNOSIS — E038 Other specified hypothyroidism: Secondary | ICD-10-CM | POA: Diagnosis not present

## 2017-08-07 DIAGNOSIS — I87303 Chronic venous hypertension (idiopathic) without complications of bilateral lower extremity: Secondary | ICD-10-CM | POA: Diagnosis not present

## 2017-08-14 DIAGNOSIS — H9191 Unspecified hearing loss, right ear: Secondary | ICD-10-CM | POA: Diagnosis not present

## 2017-08-14 DIAGNOSIS — F418 Other specified anxiety disorders: Secondary | ICD-10-CM | POA: Diagnosis not present

## 2017-08-14 DIAGNOSIS — I739 Peripheral vascular disease, unspecified: Secondary | ICD-10-CM | POA: Diagnosis not present

## 2017-08-14 DIAGNOSIS — N400 Enlarged prostate without lower urinary tract symptoms: Secondary | ICD-10-CM | POA: Diagnosis not present

## 2017-08-14 DIAGNOSIS — I10 Essential (primary) hypertension: Secondary | ICD-10-CM | POA: Diagnosis not present

## 2017-08-14 DIAGNOSIS — E039 Hypothyroidism, unspecified: Secondary | ICD-10-CM | POA: Diagnosis not present

## 2017-08-14 DIAGNOSIS — R42 Dizziness and giddiness: Secondary | ICD-10-CM | POA: Diagnosis not present

## 2017-08-14 DIAGNOSIS — E669 Obesity, unspecified: Secondary | ICD-10-CM | POA: Diagnosis not present

## 2017-08-14 DIAGNOSIS — Z6837 Body mass index (BMI) 37.0-37.9, adult: Secondary | ICD-10-CM | POA: Diagnosis not present

## 2017-12-08 DIAGNOSIS — I10 Essential (primary) hypertension: Secondary | ICD-10-CM | POA: Diagnosis not present

## 2017-12-08 DIAGNOSIS — H8109 Meniere's disease, unspecified ear: Secondary | ICD-10-CM | POA: Diagnosis not present

## 2017-12-08 DIAGNOSIS — E038 Other specified hypothyroidism: Secondary | ICD-10-CM | POA: Diagnosis not present

## 2017-12-08 DIAGNOSIS — Z23 Encounter for immunization: Secondary | ICD-10-CM | POA: Diagnosis not present

## 2017-12-08 DIAGNOSIS — R7303 Prediabetes: Secondary | ICD-10-CM | POA: Diagnosis not present

## 2017-12-08 DIAGNOSIS — G609 Hereditary and idiopathic neuropathy, unspecified: Secondary | ICD-10-CM | POA: Diagnosis not present

## 2017-12-08 DIAGNOSIS — I87303 Chronic venous hypertension (idiopathic) without complications of bilateral lower extremity: Secondary | ICD-10-CM | POA: Diagnosis not present

## 2017-12-08 DIAGNOSIS — H906 Mixed conductive and sensorineural hearing loss, bilateral: Secondary | ICD-10-CM | POA: Diagnosis not present

## 2017-12-08 DIAGNOSIS — G031 Chronic meningitis: Secondary | ICD-10-CM | POA: Diagnosis not present

## 2017-12-08 DIAGNOSIS — E782 Mixed hyperlipidemia: Secondary | ICD-10-CM | POA: Diagnosis not present

## 2018-04-10 DIAGNOSIS — I87303 Chronic venous hypertension (idiopathic) without complications of bilateral lower extremity: Secondary | ICD-10-CM | POA: Diagnosis not present

## 2018-04-10 DIAGNOSIS — R7303 Prediabetes: Secondary | ICD-10-CM | POA: Diagnosis not present

## 2018-04-10 DIAGNOSIS — F320 Major depressive disorder, single episode, mild: Secondary | ICD-10-CM | POA: Diagnosis not present

## 2018-04-10 DIAGNOSIS — I10 Essential (primary) hypertension: Secondary | ICD-10-CM | POA: Diagnosis not present

## 2018-04-10 DIAGNOSIS — G4733 Obstructive sleep apnea (adult) (pediatric): Secondary | ICD-10-CM | POA: Diagnosis not present

## 2018-04-10 DIAGNOSIS — N400 Enlarged prostate without lower urinary tract symptoms: Secondary | ICD-10-CM | POA: Diagnosis not present

## 2018-04-10 DIAGNOSIS — H906 Mixed conductive and sensorineural hearing loss, bilateral: Secondary | ICD-10-CM | POA: Diagnosis not present

## 2018-04-10 DIAGNOSIS — E782 Mixed hyperlipidemia: Secondary | ICD-10-CM | POA: Diagnosis not present

## 2018-04-10 DIAGNOSIS — E038 Other specified hypothyroidism: Secondary | ICD-10-CM | POA: Diagnosis not present

## 2018-07-19 DIAGNOSIS — Z23 Encounter for immunization: Secondary | ICD-10-CM | POA: Diagnosis not present

## 2018-10-07 DIAGNOSIS — I890 Lymphedema, not elsewhere classified: Secondary | ICD-10-CM | POA: Diagnosis not present

## 2018-10-07 DIAGNOSIS — R42 Dizziness and giddiness: Secondary | ICD-10-CM | POA: Diagnosis not present

## 2018-10-07 DIAGNOSIS — F418 Other specified anxiety disorders: Secondary | ICD-10-CM | POA: Diagnosis not present

## 2018-10-07 DIAGNOSIS — I10 Essential (primary) hypertension: Secondary | ICD-10-CM | POA: Diagnosis not present

## 2018-10-07 DIAGNOSIS — E039 Hypothyroidism, unspecified: Secondary | ICD-10-CM | POA: Diagnosis not present

## 2018-10-07 DIAGNOSIS — N400 Enlarged prostate without lower urinary tract symptoms: Secondary | ICD-10-CM | POA: Diagnosis not present

## 2018-10-07 DIAGNOSIS — E669 Obesity, unspecified: Secondary | ICD-10-CM | POA: Diagnosis not present

## 2018-10-07 DIAGNOSIS — H9190 Unspecified hearing loss, unspecified ear: Secondary | ICD-10-CM | POA: Diagnosis not present

## 2018-10-07 DIAGNOSIS — I739 Peripheral vascular disease, unspecified: Secondary | ICD-10-CM | POA: Diagnosis not present

## 2019-01-03 DIAGNOSIS — G4733 Obstructive sleep apnea (adult) (pediatric): Secondary | ICD-10-CM | POA: Diagnosis not present

## 2019-01-03 DIAGNOSIS — N400 Enlarged prostate without lower urinary tract symptoms: Secondary | ICD-10-CM | POA: Diagnosis not present

## 2019-01-03 DIAGNOSIS — R42 Dizziness and giddiness: Secondary | ICD-10-CM | POA: Diagnosis not present

## 2019-01-03 DIAGNOSIS — E669 Obesity, unspecified: Secondary | ICD-10-CM | POA: Diagnosis not present

## 2019-01-03 DIAGNOSIS — H9191 Unspecified hearing loss, right ear: Secondary | ICD-10-CM | POA: Diagnosis not present

## 2019-01-03 DIAGNOSIS — I10 Essential (primary) hypertension: Secondary | ICD-10-CM | POA: Diagnosis not present

## 2019-01-03 DIAGNOSIS — E039 Hypothyroidism, unspecified: Secondary | ICD-10-CM | POA: Diagnosis not present

## 2019-01-03 DIAGNOSIS — F325 Major depressive disorder, single episode, in full remission: Secondary | ICD-10-CM | POA: Diagnosis not present

## 2019-01-03 DIAGNOSIS — H547 Unspecified visual loss: Secondary | ICD-10-CM | POA: Diagnosis not present

## 2020-03-02 DIAGNOSIS — D61818 Other pancytopenia: Secondary | ICD-10-CM

## 2020-03-02 DIAGNOSIS — J9601 Acute respiratory failure with hypoxia: Secondary | ICD-10-CM

## 2020-03-02 DIAGNOSIS — U071 COVID-19: Secondary | ICD-10-CM

## 2020-03-02 DIAGNOSIS — R531 Weakness: Secondary | ICD-10-CM

## 2020-03-03 DIAGNOSIS — R531 Weakness: Secondary | ICD-10-CM | POA: Diagnosis not present

## 2020-03-03 DIAGNOSIS — D61818 Other pancytopenia: Secondary | ICD-10-CM | POA: Diagnosis not present

## 2020-03-03 DIAGNOSIS — U071 COVID-19: Secondary | ICD-10-CM | POA: Diagnosis not present

## 2020-03-04 DIAGNOSIS — D72819 Decreased white blood cell count, unspecified: Secondary | ICD-10-CM

## 2020-03-04 DIAGNOSIS — D708 Other neutropenia: Secondary | ICD-10-CM

## 2020-03-04 DIAGNOSIS — U071 COVID-19: Secondary | ICD-10-CM | POA: Diagnosis not present

## 2020-03-04 DIAGNOSIS — J9601 Acute respiratory failure with hypoxia: Secondary | ICD-10-CM | POA: Diagnosis not present

## 2020-03-04 DIAGNOSIS — D61818 Other pancytopenia: Secondary | ICD-10-CM | POA: Diagnosis not present

## 2020-03-04 DIAGNOSIS — R531 Weakness: Secondary | ICD-10-CM | POA: Diagnosis not present

## 2020-03-05 DIAGNOSIS — U071 COVID-19: Secondary | ICD-10-CM | POA: Diagnosis not present

## 2020-03-05 DIAGNOSIS — R531 Weakness: Secondary | ICD-10-CM | POA: Diagnosis not present

## 2020-03-05 DIAGNOSIS — D61818 Other pancytopenia: Secondary | ICD-10-CM | POA: Diagnosis not present

## 2020-03-06 DIAGNOSIS — U071 COVID-19: Secondary | ICD-10-CM | POA: Diagnosis not present

## 2020-03-06 DIAGNOSIS — D61818 Other pancytopenia: Secondary | ICD-10-CM | POA: Diagnosis not present

## 2020-03-06 DIAGNOSIS — R531 Weakness: Secondary | ICD-10-CM | POA: Diagnosis not present
